# Patient Record
Sex: Female | Born: 1965 | Race: White | Hispanic: No | Smoking: Never smoker
Health system: Southern US, Community
[De-identification: ages and names within clinical notes are randomized; demographics above are authoritative.]

## PROBLEM LIST (undated history)

## (undated) DIAGNOSIS — E039 Hypothyroidism, unspecified: Secondary | ICD-10-CM

---

## 2017-01-21 ENCOUNTER — Emergency Department (INDEPENDENT_AMBULATORY_CARE_PROVIDER_SITE_OTHER): Payer: BLUE CROSS/BLUE SHIELD

## 2017-01-21 ENCOUNTER — Encounter: Payer: Self-pay | Admitting: Emergency Medicine

## 2017-01-21 ENCOUNTER — Emergency Department (INDEPENDENT_AMBULATORY_CARE_PROVIDER_SITE_OTHER)
Admission: EM | Admit: 2017-01-21 | Discharge: 2017-01-21 | Disposition: A | Discharge status: Home / Self Care | Payer: BLUE CROSS/BLUE SHIELD | Source: Home / Self Care | Attending: Family Medicine | Admitting: Family Medicine

## 2017-01-21 DIAGNOSIS — S60221A Contusion of right hand, initial encounter: Secondary | ICD-10-CM

## 2017-01-21 DIAGNOSIS — M25541 Pain in joints of right hand: Secondary | ICD-10-CM

## 2017-01-21 HISTORY — DX: Hypothyroidism, unspecified: E03.9

## 2017-01-21 NOTE — ED Provider Notes (Signed)
CSN: [id number]     Arrival date & time 01/21/17  1336 History   First MD Initiated Contact with Patient 01/21/17 1352     Chief Complaint  Patient presents with  . Hand Injury   (Consider location/radiation/quality/duration/timing/severity/associated sxs/prior Treatment) HPI  Katie Parker is a 51 y.o. female presenting to UC with c/o Right index finger pain with swelling and soreness over the 2nd MCP joint.  Pain started last night after hitting her hand on her car while getting her kids out of the car last night.  She did have pain last night but worsening pain and swelling today. Decreased ROM due to the pain. Pain is 5/10 now. She took ibuprofen earlier. No other injuries. She is Right hand dominant. No wound to her hand.    Past Medical History:  Diagnosis Date  . Hypothyroid    Past Surgical History:  Procedure Laterality Date  . CESAREAN SECTION     History reviewed. No pertinent family history. Social History  Substance Use Topics  . Smoking status: Never Smoker  . Smokeless tobacco: Never Used  . Alcohol use No   OB History    No data available     Review of Systems  Musculoskeletal: Positive for arthralgias and joint swelling. Negative for myalgias.  Skin: Negative for color change and wound.  Neurological: Negative for weakness and numbness.    Allergies  Patient has no known allergies.  Home Medications   Prior to Admission medications   Medication Sig Start Date End Date Taking? Authorizing Provider  THYROID PO Take by mouth.   Yes Historical Provider, MD   Meds Ordered and Administered this Visit  Medications - No data to display  BP 109/72 (BP Location: Left Arm)   Pulse 84   Temp 98.3 F (36.8 C) (Oral)   Ht  (1.626 m)   Wt 176 lb (79.8 kg)   SpO2 99%   BMI 30.21 kg/m  No data found.   Physical Exam  Constitutional: She is oriented to person, place, and time. She appears well-developed and well-nourished.  HENT:  Head:  Normocephalic and atraumatic.  Eyes: EOM are normal.  Neck: Normal range of motion.  Cardiovascular: Normal rate.   Pulmonary/Chest: Effort normal.  Musculoskeletal: She exhibits edema and tenderness.  Right hand, dorsal aspect: mild edema over 2nd MCP joint.  Tender. Decreased flexion at MCP joint due to pain.   Neurological: She is alert and oriented to person, place, and time.  Skin: Skin is warm and dry. Capillary refill takes less than 2 seconds.  Right hand: skin in tact. No ecchymosis or erythema.   Psychiatric: She has a normal mood and affect. Her behavior is normal.  Nursing note and vitals reviewed.   Urgent Care Course     Procedures (including critical care time)  Labs Review Labs Reviewed - No data to display  Imaging Review Dg Hand Complete Right  Result Date: 01/21/2017 CLINICAL DATA:  Base of the second digit pain post blunt trauma. EXAM: RIGHT HAND - COMPLETE 3+ VIEW COMPARISON:  None. FINDINGS: There is no evidence of fracture or dislocation. There is no evidence of arthropathy or other focal bone abnormality. Soft tissues are unremarkable. IMPRESSION: Negative. Electronically Signed   By: Ted Mcalpine M.D.   On: 01/21/2017 14:02     MDM   1. Contusion of right hand, initial encounter    Pt c/o Right hand pain with swelling and tenderness over 2nd MCP joint.  Plain films:  Negative for fracture or dislocation.  Fingers strapped together using buddy tapping technique by Towanda Malkin, CMA  Home care instructions provided.  Encouraged f/u with PCP or Sports Medicine in 1 week if not improving.     Junius Finner, PA-C 01/21/17 1417

## 2017-01-21 NOTE — ED Triage Notes (Signed)
Pt hit her right index finger, swelling, painful, some redness.

## 2017-01-21 NOTE — Discharge Instructions (Signed)
°  You may take acetaminophen and ibuprofen as needed for pain.  You may also apply a cool compress over the sore joint for 15-20 minutes at a time 3-4 times daily.  Be sure the use a thin cloth between ice and skin to help prevent damage to your skin from ice being too cold.

## 2018-10-19 IMAGING — DX DG HAND COMPLETE 3+V*R*
3 series · 3 of 3 positions shown · non-contrast
Comparison: None.

CLINICAL DATA: Base of the second digit pain post blunt trauma.

EXAM:
RIGHT HAND - COMPLETE 3+ VIEW

[hand pa]
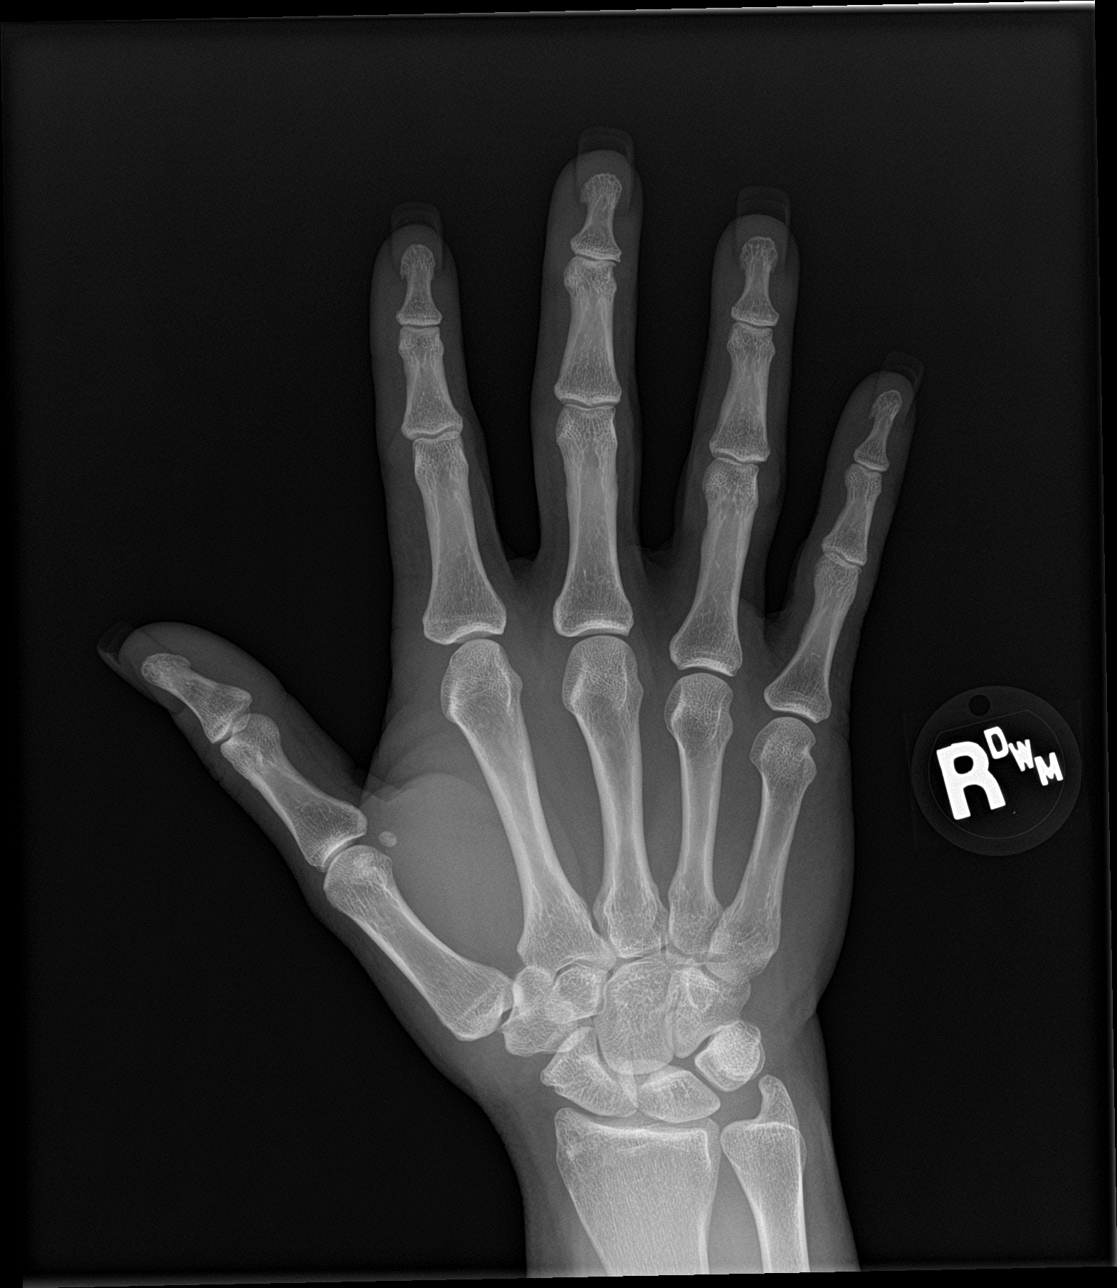

[hand obl]
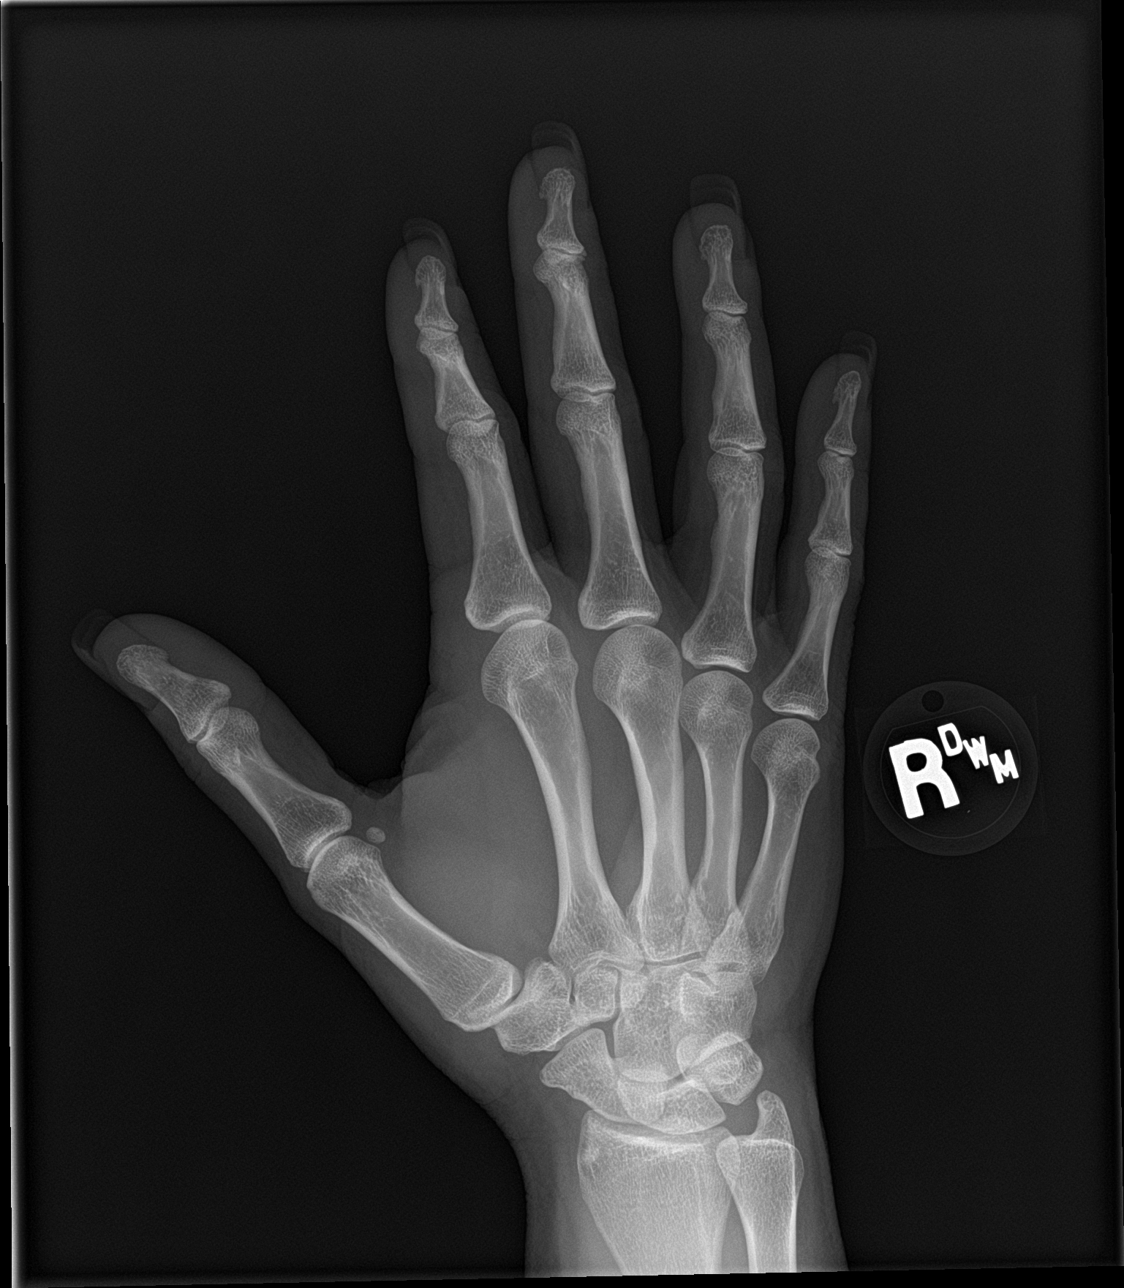

[hand lat]
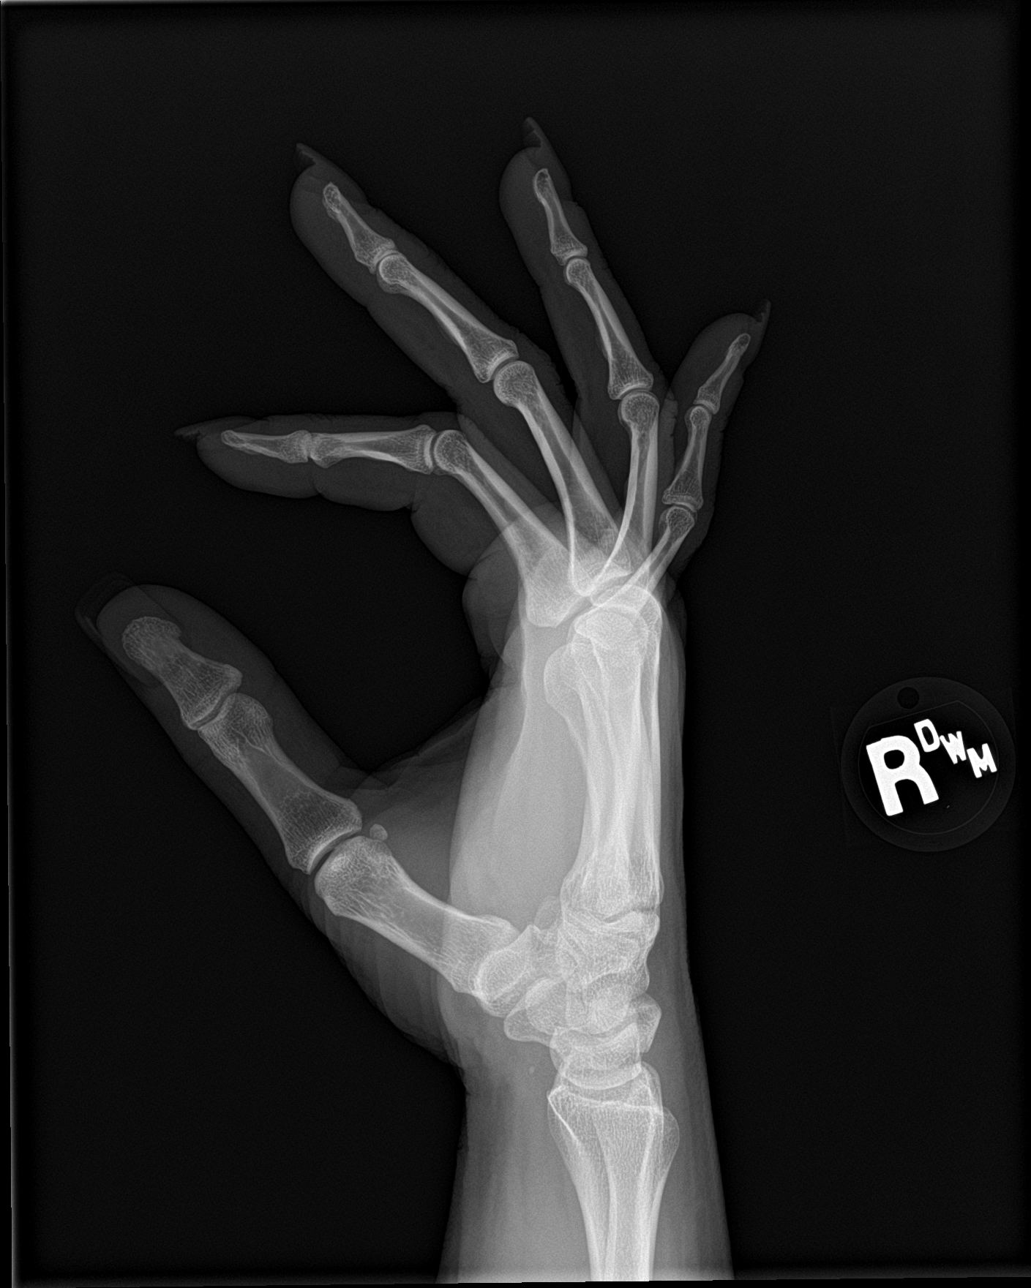

[3 of 3 positions shown; findings below may reference images not displayed]

FINDINGS: There is no evidence of fracture or dislocation. There is no
evidence of arthropathy or other focal bone abnormality. Soft
tissues are unremarkable.
IMPRESSION: Negative.

## 2021-07-17 ENCOUNTER — Encounter: Payer: Self-pay | Admitting: Emergency Medicine

## 2021-07-17 ENCOUNTER — Other Ambulatory Visit: Payer: Self-pay

## 2021-07-17 ENCOUNTER — Emergency Department: Admit: 2021-07-17 | Payer: Self-pay

## 2021-07-17 ENCOUNTER — Emergency Department (INDEPENDENT_AMBULATORY_CARE_PROVIDER_SITE_OTHER)
Admission: EM | Admit: 2021-07-17 | Discharge: 2021-07-17 | Disposition: A | Discharge status: Home / Self Care | Payer: BC Managed Care – PPO | Source: Home / Self Care

## 2021-07-17 DIAGNOSIS — N3001 Acute cystitis with hematuria: Secondary | ICD-10-CM

## 2021-07-17 DIAGNOSIS — R109 Unspecified abdominal pain: Secondary | ICD-10-CM | POA: Diagnosis not present

## 2021-07-17 LAB — POCT URINALYSIS DIP (MANUAL ENTRY)
Bilirubin, UA: NEGATIVE
Glucose, UA: NEGATIVE mg/dL
Ketones, POC UA: NEGATIVE mg/dL
Leukocytes, UA: NEGATIVE
Nitrite, UA: NEGATIVE
Protein Ur, POC: NEGATIVE mg/dL
Spec Grav, UA: <=1.005 — AB (ref 1.010–1.025)
Urobilinogen, UA: 0.2 E.U./dL
pH, UA: 5.5 (ref 5.0–8.0)

## 2021-07-17 MED ORDER — SULFAMETHOXAZOLE-TRIMETHOPRIM 800-160 MG PO TABS: 1.0000 | ORAL_TABLET | Freq: Two times a day (BID) | ORAL | 0 refills | Status: AC

## 2021-07-17 NOTE — ED Triage Notes (Signed)
Patient presents to Urgent Care with complaints of back pain since 1 day ago. Patient reports in Sept had sepsis, double kidney infection. She has been off antibiotic for 2 weeks. Now starting to have back pain again. Having chills.

## 2021-07-17 NOTE — ED Provider Notes (Signed)
[facility]    CSN: [id number] Arrival date & time: 07/17/21  1414      History   Chief Complaint Chief Complaint  Patient presents with   Urinary Frequency    HPI Katie Parker is a 55 y.o. female.   HPI 55 year old female presents with back pain for 1 day.  Reports on 06/29/21 had double kidney infection/pyelonephritis.  Patient reports has been off antibiotic for 2 weeks and now starting to have symptoms again.  Past Medical History:  Diagnosis Date   Hypothyroid     There are no problems to display for this patient.   Past Surgical History:  Procedure Laterality Date   CESAREAN SECTION      OB History   No obstetric history on file.      Home Medications    Prior to Admission medications   Medication Sig Start Date End Date Taking? Authorizing Provider  amphetamine-dextroamphetamine (ADDERALL XR) 30 MG 24 hr capsule Take 30 mg by mouth daily as needed. 07/11/21  Yes [provider]  ARMOUR THYROID 60 MG tablet Take 60 mg by mouth daily. 05/23/21  Yes [provider]  progesterone (PROMETRIUM) 100 MG capsule Take 100 mg by mouth at bedtime. 05/23/21  Yes [provider]  sulfamethoxazole-trimethoprim (BACTRIM DS) 800-160 MG tablet Take 1 tablet by mouth 2 (two) times daily for 3 days. 07/17/21 07/20/21 Yes Trevor Iha, FNP  Testosterone 30 MG MISC 6 %.   Yes [provider]  thyroid (ARMOUR) 60 MG tablet Take by mouth. 08/10/20  Yes [provider]  thyroid (ARMOUR) 90 MG tablet Take 90 mg by mouth daily.   Yes [provider]  THYROID PO Take by mouth.    [provider]    Family History History reviewed. No pertinent family history.  Social History Social History   Tobacco Use   Smoking status: Never   Smokeless tobacco: Never  Vaping Use   Vaping Use: Never used  Substance Use Topics   Alcohol use: No   Drug use: Never     Allergies   Codeine   Review of  Systems Review of Systems  Genitourinary:  Positive for frequency and urgency.    Physical Exam Triage Vital Signs ED Triage Vitals  Enc Vitals Group     BP 07/17/21 1510 128/77     Pulse Rate 07/17/21 1510 98     Resp 07/17/21 1510 16     Temp 07/17/21 1510 99 F (37.2 C)     Temp Source 07/17/21 1510 Oral     SpO2 07/17/21 1510 98 %     Weight --      Height --      Head Circumference --      Peak Flow --      Pain Score 07/17/21 1505 4     Pain Loc --      Pain Edu? --      Excl. in GC? --    No data found.  Updated Vital Signs BP 128/77 (BP Location: Left Arm)   Pulse 98   Temp 99 F (37.2 C) (Oral)   Resp 16   SpO2 98%      Physical Exam Vitals and nursing note reviewed.  Constitutional:      Appearance: Normal appearance. She is obese.  HENT:     Head: Normocephalic and atraumatic.     Mouth/Throat:     Mouth: Mucous membranes are moist.  Pharynx: Oropharynx is clear.  Eyes:     Extraocular Movements: Extraocular movements intact.     Pupils: Pupils are equal, round, and reactive to light.  Cardiovascular:     Rate and Rhythm: Normal rate and regular rhythm.     Pulses: Normal pulses.     Heart sounds: Normal heart sounds.  Pulmonary:     Effort: Pulmonary effort is normal.     Breath sounds: Normal breath sounds.  Abdominal:     Tenderness: There is no right CVA tenderness or left CVA tenderness.  Musculoskeletal:     Cervical back: Normal range of motion and neck supple.  Skin:    General: Skin is warm and dry.  Neurological:     General: No focal deficit present.     Mental Status: She is alert and oriented to person, place, and time. Mental status is at baseline.     UC Treatments / Results  Labs (all labs ordered are listed, but only abnormal results are displayed) Labs Reviewed  POCT URINALYSIS DIP (MANUAL ENTRY) - Abnormal; Notable for the following components:      Result Value   Spec Grav, UA <=1.005 (*)    Blood, UA  trace-lysed (*)    All other components within normal limits    EKG   Radiology No results found.  Procedures Procedures (including critical care time)  Medications Ordered in UC Medications - No data to display  Initial Impression / Assessment and Plan / UC Course  I have reviewed the triage vital signs and the nursing notes.  Pertinent labs & imaging results that were available during my care of the patient were reviewed by me and considered in my medical decision making (see chart for details).    MDM: 1.  Acute cystitis with hematuria-Advised patient to take medication as directed with food to completion.  Advised patient we will follow-up with urine culture results once received.  Advised/instructed patient if flank pain worsens and/or accompanied by fever please go to nearest ED for immediate evaluation.  Encouraged patient increase daily water intake while taking these medications.  Final Clinical Impressions(s) / UC Diagnoses   Final diagnoses:  Flank pain  Acute cystitis with hematuria     Discharge Instructions      Advised patient to take medication as directed with food to completion.  Advised patient we will follow-up with urine culture results once received.  Advised/instructed patient if flank pain worsens and/or accompanied by fever please go to nearest ED for immediate evaluation.  Encourage patient increase daily water intake while taking these medications.     ED Prescriptions     Medication Sig Dispense Auth. Provider   sulfamethoxazole-trimethoprim (BACTRIM DS) 800-160 MG tablet Take 1 tablet by mouth 2 (two) times daily for 3 days. 6 tablet Trevor Iha, FNP      PDMP not reviewed this encounter.   Trevor Iha, FNP 07/17/21 [phone]

## 2021-07-17 NOTE — Discharge Instructions (Addendum)
Advised patient to take medication as directed with food to completion.  Advised patient we will follow-up with urine culture results once received.  Advised/instructed patient if flank pain worsens and/or accompanied by fever please go to nearest ED for immediate evaluation.  Encourage patient increase daily water intake while taking these medications.

## 2021-07-19 LAB — URINE CULTURE
MICRO NUMBER:: 12540671
SPECIMEN QUALITY:: ADEQUATE
